# Patient Record
Sex: Female | Born: 1960 | Race: White | Hispanic: No | Marital: Married | Smoking: Never smoker
Health system: Southern US, Community
[De-identification: ages and names within clinical notes are randomized; demographics above are authoritative.]

## PROBLEM LIST (undated history)

## (undated) DIAGNOSIS — M4317 Spondylolisthesis, lumbosacral region: Secondary | ICD-10-CM

## (undated) DIAGNOSIS — K219 Gastro-esophageal reflux disease without esophagitis: Secondary | ICD-10-CM

## (undated) DIAGNOSIS — M25569 Pain in unspecified knee: Secondary | ICD-10-CM

## (undated) DIAGNOSIS — R079 Chest pain, unspecified: Secondary | ICD-10-CM

## (undated) DIAGNOSIS — E782 Mixed hyperlipidemia: Secondary | ICD-10-CM

## (undated) DIAGNOSIS — G43909 Migraine, unspecified, not intractable, without status migrainosus: Secondary | ICD-10-CM

## (undated) DIAGNOSIS — R519 Headache, unspecified: Secondary | ICD-10-CM

## (undated) DIAGNOSIS — R51 Headache: Secondary | ICD-10-CM

## (undated) HISTORY — DX: Chest pain, unspecified: R07.9

## (undated) HISTORY — PX: NO PAST SURGERIES: SHX2092

## (undated) HISTORY — DX: Pain in unspecified knee: M25.569

## (undated) HISTORY — DX: Migraine, unspecified, not intractable, without status migrainosus: G43.909

## (undated) HISTORY — DX: Mixed hyperlipidemia: E78.2

## (undated) HISTORY — DX: Spondylolisthesis, lumbosacral region: M43.17

---

## 2014-07-13 ENCOUNTER — Other Ambulatory Visit: Payer: Self-pay | Admitting: Neurosurgery

## 2014-11-30 ENCOUNTER — Encounter (HOSPITAL_COMMUNITY): Payer: Self-pay

## 2014-11-30 ENCOUNTER — Encounter (HOSPITAL_COMMUNITY)
Admission: RE | Admit: 2014-11-30 | Discharge: 2014-11-30 | Disposition: A | Discharge status: Home / Self Care | Payer: 59 | Source: Ambulatory Visit | Attending: Neurosurgery | Admitting: Neurosurgery

## 2014-11-30 DIAGNOSIS — Z01812 Encounter for preprocedural laboratory examination: Secondary | ICD-10-CM | POA: Diagnosis present

## 2014-11-30 DIAGNOSIS — Z01810 Encounter for preprocedural cardiovascular examination: Secondary | ICD-10-CM | POA: Diagnosis not present

## 2014-11-30 DIAGNOSIS — M4310 Spondylolisthesis, site unspecified: Secondary | ICD-10-CM | POA: Insufficient documentation

## 2014-11-30 DIAGNOSIS — Z0183 Encounter for blood typing: Secondary | ICD-10-CM | POA: Diagnosis not present

## 2014-11-30 HISTORY — DX: Gastro-esophageal reflux disease without esophagitis: K21.9

## 2014-11-30 HISTORY — DX: Headache, unspecified: R51.9

## 2014-11-30 HISTORY — DX: Headache: R51

## 2014-11-30 LAB — BASIC METABOLIC PANEL
ANION GAP: 6 (ref 5–15)
BUN: 7 mg/dL (ref 6–20)
CHLORIDE: 106 mmol/L (ref 101–111)
CO2: 30 mmol/L (ref 22–32)
Calcium: 9.6 mg/dL (ref 8.9–10.3)
Creatinine, Ser: 0.82 mg/dL (ref 0.44–1.00)
GFR calc non Af Amer: >60 mL/min (ref >60)
GLUCOSE: 106 mg/dL — AB (ref 65–99)
Potassium: 4.6 mmol/L (ref 3.5–5.1)
SODIUM: 142 mmol/L (ref 135–145)

## 2014-11-30 LAB — CBC
HCT: 42.3 % (ref 36.0–46.0)
Hemoglobin: 14 g/dL (ref 12.0–15.0)
MCH: 30.4 pg (ref 26.0–34.0)
MCHC: 33.1 g/dL (ref 30.0–36.0)
MCV: 92 fL (ref 78.0–100.0)
Platelets: 251 10*3/uL (ref 150–400)
RBC: 4.6 MIL/uL (ref 3.87–5.11)
RDW: 14 % (ref 11.5–15.5)
WBC: 5.4 10*3/uL (ref 4.0–10.5)

## 2014-11-30 LAB — SURGICAL PCR SCREEN
MRSA, PCR: NEGATIVE
STAPHYLOCOCCUS AUREUS: NEGATIVE

## 2014-11-30 LAB — TYPE AND SCREEN
ABO/RH(D): O POS
ANTIBODY SCREEN: NEGATIVE

## 2014-11-30 LAB — ABO/RH: ABO/RH(D): O POS

## 2014-11-30 NOTE — Pre-Procedure Instructions (Signed)
    Pamela HackingSusan M Krueger  11/30/2014     No Pharmacies Listed   Your procedure is scheduled on 12/04/14.  [facility] at 530 A.M.  Call this number if you have problems the morning of surgery:  [phone]   Remember:  Do not eat food or drink liquids after midnight.  Take these medicines the morning of surgery with A SIP OF WATER none   Do not wear jewelry, make-up or nail polish.  Do not wear lotions, powders, or perfumes.  You may wear deodorant.  Do not shave 48 hours prior to surgery.  Men may shave face and neck.  Do not bring valuables to the hospital.  [facility] not responsible for any belongings or valuables.  Contacts, dentures or bridgework may not be worn into surgery.  Leave your suitcase in the car.  After surgery it may be brought to your room.  For patients admitted to the hospital, discharge time will be determined by your treatment team.  Patients discharged the day of surgery will not be allowed to driv Special instructions:   Please read over the following fact sheets that you were given. Pain Booklet, Coughing and Deep Breathing, Blood Transfusion Information, MRSA Information and Surgical Site Infection Prevention

## 2014-12-03 MED ORDER — CEFAZOLIN SODIUM-DEXTROSE 2-3 GM-% IV SOLR
2.0000 g | INTRAVENOUS | Status: AC
  Administered 2014-12-04: 2 g via INTRAVENOUS
  Filled 2014-12-03: qty 50

## 2014-12-04 ENCOUNTER — Inpatient Hospital Stay (HOSPITAL_COMMUNITY)
Admission: RE | Admit: 2014-12-04 | Discharge: 2014-12-06 | DRG: 460 | Disposition: A | Discharge status: Home / Self Care | Payer: 59 | Source: Ambulatory Visit | Attending: Neurosurgery | Admitting: Neurosurgery

## 2014-12-04 ENCOUNTER — Encounter (HOSPITAL_COMMUNITY): Payer: Self-pay

## 2014-12-04 ENCOUNTER — Inpatient Hospital Stay (HOSPITAL_COMMUNITY): Payer: 59

## 2014-12-04 ENCOUNTER — Inpatient Hospital Stay (HOSPITAL_COMMUNITY): Payer: 59 | Admitting: Certified Registered"

## 2014-12-04 ENCOUNTER — Encounter (HOSPITAL_COMMUNITY)
Admission: RE | Disposition: A | Discharge status: Home / Self Care | Payer: Self-pay | Source: Ambulatory Visit | Attending: Neurosurgery

## 2014-12-04 DIAGNOSIS — Z79899 Other long term (current) drug therapy: Secondary | ICD-10-CM | POA: Diagnosis not present

## 2014-12-04 DIAGNOSIS — Z881 Allergy status to other antibiotic agents status: Secondary | ICD-10-CM

## 2014-12-04 DIAGNOSIS — K219 Gastro-esophageal reflux disease without esophagitis: Secondary | ICD-10-CM | POA: Diagnosis present

## 2014-12-04 DIAGNOSIS — Z419 Encounter for procedure for purposes other than remedying health state, unspecified: Secondary | ICD-10-CM

## 2014-12-04 DIAGNOSIS — Z791 Long term (current) use of non-steroidal anti-inflammatories (NSAID): Secondary | ICD-10-CM

## 2014-12-04 DIAGNOSIS — M4317 Spondylolisthesis, lumbosacral region: Principal | ICD-10-CM

## 2014-12-04 HISTORY — DX: Spondylolisthesis, lumbosacral region: M43.17

## 2014-12-04 LAB — PREGNANCY, URINE: PREG TEST UR: NEGATIVE

## 2014-12-04 SURGERY — POSTERIOR LUMBAR FUSION 1 LEVEL
Anesthesia: General | Site: Back | Laterality: Bilateral

## 2014-12-04 MED ORDER — CYCLOBENZAPRINE HCL 10 MG PO TABS
10.0000 mg | ORAL_TABLET | Freq: Three times a day (TID) | ORAL | Status: DC | PRN
  Administered 2014-12-04 – 2014-12-06 (×6): 10 mg via ORAL
  Filled 2014-12-04 (×5): qty 1

## 2014-12-04 MED ORDER — BUPIVACAINE LIPOSOME 1.3 % IJ SUSP
20.0000 mL | INTRAMUSCULAR | Status: AC
  Administered 2014-12-04: 20 mL
  Filled 2014-12-04: qty 20

## 2014-12-04 MED ORDER — MENTHOL 3 MG MT LOZG
1.0000 | LOZENGE | OROMUCOSAL | Status: DC | PRN
  Administered 2014-12-05 (×2): 3 mg via ORAL
  Filled 2014-12-04 (×2): qty 9

## 2014-12-04 MED ORDER — OXYCODONE-ACETAMINOPHEN 5-325 MG PO TABS
1.0000 | ORAL_TABLET | ORAL | Status: DC | PRN
  Administered 2014-12-04 – 2014-12-06 (×10): 2 via ORAL
  Filled 2014-12-04 (×3): qty 2
  Filled 2014-12-04: qty 1
  Filled 2014-12-04 (×7): qty 2

## 2014-12-04 MED ORDER — VANCOMYCIN HCL 1000 MG IV SOLR
INTRAVENOUS | Status: AC
  Filled 2014-12-04: qty 1000

## 2014-12-04 MED ORDER — PHENYLEPHRINE HCL 10 MG/ML IJ SOLN
INTRAMUSCULAR | Status: DC | PRN
  Administered 2014-12-04: 80 ug via INTRAVENOUS
  Administered 2014-12-04: 40 ug via INTRAVENOUS
  Administered 2014-12-04: 80 ug via INTRAVENOUS

## 2014-12-04 MED ORDER — LACTATED RINGERS IV SOLN: INTRAVENOUS | Status: DC

## 2014-12-04 MED ORDER — FENTANYL CITRATE (PF) 250 MCG/5ML IJ SOLN
INTRAMUSCULAR | Status: AC
  Filled 2014-12-04: qty 5

## 2014-12-04 MED ORDER — ADULT MULTIVITAMIN W/MINERALS CH
1.0000 | ORAL_TABLET | Freq: Every day | ORAL | Status: DC
  Administered 2014-12-05 – 2014-12-06 (×2): 1 via ORAL
  Filled 2014-12-04 (×2): qty 1

## 2014-12-04 MED ORDER — MIDAZOLAM HCL 5 MG/5ML IJ SOLN
INTRAMUSCULAR | Status: DC | PRN
  Administered 2014-12-04: 2 mg via INTRAVENOUS

## 2014-12-04 MED ORDER — AZITHROMYCIN 1 G PO PACK: 1.0000 g | PACK | Freq: Once | ORAL | Status: DC

## 2014-12-04 MED ORDER — OXYCODONE HCL 5 MG PO TABS
5.0000 mg | ORAL_TABLET | Freq: Once | ORAL | Status: AC | PRN
  Administered 2014-12-04: 5 mg via ORAL

## 2014-12-04 MED ORDER — CEFAZOLIN SODIUM 1-5 GM-% IV SOLN
1.0000 g | Freq: Three times a day (TID) | INTRAVENOUS | Status: AC
  Administered 2014-12-04 (×2): 1 g via INTRAVENOUS
  Filled 2014-12-04 (×2): qty 50

## 2014-12-04 MED ORDER — ACETAMINOPHEN 325 MG PO TABS: 650.0000 mg | ORAL_TABLET | ORAL | Status: DC | PRN

## 2014-12-04 MED ORDER — CYCLOBENZAPRINE HCL 10 MG PO TABS
ORAL_TABLET | ORAL | Status: AC
  Filled 2014-12-04: qty 1

## 2014-12-04 MED ORDER — PHENOL 1.4 % MT LIQD: 1.0000 | OROMUCOSAL | Status: DC | PRN

## 2014-12-04 MED ORDER — SODIUM CHLORIDE 0.9 % IJ SOLN
3.0000 mL | Freq: Two times a day (BID) | INTRAMUSCULAR | Status: DC
  Administered 2014-12-04 – 2014-12-05 (×2): 3 mL via INTRAVENOUS

## 2014-12-04 MED ORDER — NEOSTIGMINE METHYLSULFATE 10 MG/10ML IV SOLN
INTRAVENOUS | Status: DC | PRN
  Administered 2014-12-04: 4 mg via INTRAVENOUS

## 2014-12-04 MED ORDER — SENNOSIDES-DOCUSATE SODIUM 8.6-50 MG PO TABS: 1.0000 | ORAL_TABLET | Freq: Every evening | ORAL | Status: DC | PRN

## 2014-12-04 MED ORDER — HYDROMORPHONE HCL 1 MG/ML IJ SOLN
0.5000 mg | INTRAMUSCULAR | Status: DC | PRN
  Administered 2014-12-04: 1 mg via INTRAVENOUS
  Filled 2014-12-04: qty 1

## 2014-12-04 MED ORDER — SODIUM CHLORIDE 0.9 % IV SOLN: 250.0000 mL | INTRAVENOUS | Status: DC

## 2014-12-04 MED ORDER — GLYCOPYRROLATE 0.2 MG/ML IJ SOLN
INTRAMUSCULAR | Status: AC
  Filled 2014-12-04: qty 3

## 2014-12-04 MED ORDER — ALUM & MAG HYDROXIDE-SIMETH 200-200-20 MG/5ML PO SUSP: 30.0000 mL | Freq: Four times a day (QID) | ORAL | Status: DC | PRN

## 2014-12-04 MED ORDER — SODIUM CHLORIDE 0.9 % IJ SOLN: 3.0000 mL | INTRAMUSCULAR | Status: DC | PRN

## 2014-12-04 MED ORDER — ARTIFICIAL TEARS OP OINT
TOPICAL_OINTMENT | OPHTHALMIC | Status: DC | PRN
  Administered 2014-12-04: 1 via OPHTHALMIC

## 2014-12-04 MED ORDER — OXYCODONE HCL 5 MG/5ML PO SOLN: 5.0000 mg | Freq: Once | ORAL | Status: AC | PRN

## 2014-12-04 MED ORDER — SODIUM CHLORIDE 0.9 % IR SOLN
Status: DC | PRN
  Administered 2014-12-04: 500 mL

## 2014-12-04 MED ORDER — HYDROMORPHONE HCL 1 MG/ML IJ SOLN
0.2500 mg | INTRAMUSCULAR | Status: DC | PRN
  Administered 2014-12-04 (×4): 0.5 mg via INTRAVENOUS

## 2014-12-04 MED ORDER — FENTANYL CITRATE (PF) 100 MCG/2ML IJ SOLN
INTRAMUSCULAR | Status: DC | PRN
  Administered 2014-12-04 (×3): 50 ug via INTRAVENOUS
  Administered 2014-12-04: 100 ug via INTRAVENOUS

## 2014-12-04 MED ORDER — ONDANSETRON HCL 4 MG/2ML IJ SOLN: 4.0000 mg | INTRAMUSCULAR | Status: DC | PRN

## 2014-12-04 MED ORDER — HYDROMORPHONE HCL 1 MG/ML IJ SOLN
INTRAMUSCULAR | Status: AC
  Filled 2014-12-04: qty 1

## 2014-12-04 MED ORDER — ROCURONIUM BROMIDE 50 MG/5ML IV SOLN
INTRAVENOUS | Status: AC
  Filled 2014-12-04: qty 2

## 2014-12-04 MED ORDER — PROPOFOL 10 MG/ML IV BOLUS
INTRAVENOUS | Status: AC
  Filled 2014-12-04: qty 20

## 2014-12-04 MED ORDER — THROMBIN 20000 UNITS EX SOLR
CUTANEOUS | Status: DC | PRN
  Administered 2014-12-04: 20 mL via TOPICAL

## 2014-12-04 MED ORDER — PHENYLEPHRINE HCL 10 MG/ML IJ SOLN
10.0000 mg | INTRAVENOUS | Status: DC | PRN
  Administered 2014-12-04: 15 ug/min via INTRAVENOUS

## 2014-12-04 MED ORDER — VANCOMYCIN HCL 1000 MG IV SOLR
INTRAVENOUS | Status: DC | PRN
  Administered 2014-12-04: 1000 mg

## 2014-12-04 MED ORDER — ARTIFICIAL TEARS OP OINT
TOPICAL_OINTMENT | OPHTHALMIC | Status: AC
  Filled 2014-12-04: qty 3.5

## 2014-12-04 MED ORDER — LACTATED RINGERS IV SOLN
INTRAVENOUS | Status: DC | PRN
  Administered 2014-12-04 (×2): via INTRAVENOUS

## 2014-12-04 MED ORDER — PROPOFOL 10 MG/ML IV BOLUS
INTRAVENOUS | Status: DC | PRN
  Administered 2014-12-04: 160 mg via INTRAVENOUS

## 2014-12-04 MED ORDER — OXYCODONE HCL 5 MG PO TABS
ORAL_TABLET | ORAL | Status: AC
  Filled 2014-12-04: qty 1

## 2014-12-04 MED ORDER — GLYCOPYRROLATE 0.2 MG/ML IJ SOLN
INTRAMUSCULAR | Status: DC | PRN
  Administered 2014-12-04: 0.6 mg via INTRAVENOUS

## 2014-12-04 MED ORDER — ONDANSETRON HCL 4 MG/2ML IJ SOLN
INTRAMUSCULAR | Status: AC
Start: 2014-12-04 — End: 2014-12-04
  Filled 2014-12-04: qty 2

## 2014-12-04 MED ORDER — LIDOCAINE HCL (CARDIAC) 20 MG/ML IV SOLN
INTRAVENOUS | Status: AC
  Filled 2014-12-04: qty 5

## 2014-12-04 MED ORDER — ONDANSETRON HCL 4 MG/2ML IJ SOLN
INTRAMUSCULAR | Status: DC | PRN
  Administered 2014-12-04: 4 mg via INTRAVENOUS

## 2014-12-04 MED ORDER — NEOSTIGMINE METHYLSULFATE 10 MG/10ML IV SOLN
INTRAVENOUS | Status: AC
  Filled 2014-12-04: qty 1

## 2014-12-04 MED ORDER — MIDAZOLAM HCL 2 MG/2ML IJ SOLN
INTRAMUSCULAR | Status: AC
  Filled 2014-12-04: qty 2

## 2014-12-04 MED ORDER — ROCURONIUM BROMIDE 100 MG/10ML IV SOLN
INTRAVENOUS | Status: DC | PRN
  Administered 2014-12-04: 10 mg via INTRAVENOUS
  Administered 2014-12-04: 20 mg via INTRAVENOUS
  Administered 2014-12-04: 50 mg via INTRAVENOUS
  Administered 2014-12-04: 10 mg via INTRAVENOUS

## 2014-12-04 MED ORDER — DOCUSATE SODIUM 100 MG PO CAPS
100.0000 mg | ORAL_CAPSULE | Freq: Two times a day (BID) | ORAL | Status: DC
Start: 2014-12-04 — End: 2014-12-06
  Administered 2014-12-04 – 2014-12-06 (×4): 100 mg via ORAL
  Filled 2014-12-04 (×4): qty 1

## 2014-12-04 MED ORDER — ACETAMINOPHEN 650 MG RE SUPP: 650.0000 mg | RECTAL | Status: DC | PRN

## 2014-12-04 MED ORDER — LIDOCAINE-EPINEPHRINE 1 %-1:100000 IJ SOLN
INTRAMUSCULAR | Status: DC | PRN
Start: 2014-12-04 — End: 2014-12-04
  Administered 2014-12-04: 7 mL

## 2014-12-04 MED ORDER — ONDANSETRON HCL 4 MG/2ML IJ SOLN: 4.0000 mg | Freq: Once | INTRAMUSCULAR | Status: DC | PRN

## 2014-12-04 MED ORDER — DEXAMETHASONE SODIUM PHOSPHATE 10 MG/ML IJ SOLN
10.0000 mg | INTRAMUSCULAR | Status: AC
  Administered 2014-12-04: 10 mg via INTRAVENOUS
  Filled 2014-12-04: qty 1

## 2014-12-04 MED ORDER — LIDOCAINE HCL (CARDIAC) 20 MG/ML IV SOLN
INTRAVENOUS | Status: DC | PRN
  Administered 2014-12-04: 60 mg via INTRAVENOUS

## 2014-12-04 SURGICAL SUPPLY — 73 items
ALLOSTEM STRIP 20MMX50MM (Tissue) ×3 IMPLANT
BAG DECANTER FOR FLEXI CONT (MISCELLANEOUS) ×3 IMPLANT
BENZOIN TINCTURE PRP APPL 2/3 (GAUZE/BANDAGES/DRESSINGS) ×3 IMPLANT
BLADE CLIPPER SURG (BLADE) IMPLANT
BLADE SURG 11 STRL SS (BLADE) ×3 IMPLANT
BONE ALLOSTEM MORSELIZED 1CC (Bone Implant) ×6 IMPLANT
BRUSH SCRUB EZ PLAIN DRY (MISCELLANEOUS) ×3 IMPLANT
BUR MATCHSTICK NEURO 3.0 LAGG (BURR) ×3 IMPLANT
BUR PRECISION FLUTE 6.0 (BURR) ×3 IMPLANT
CANISTER SUCT 3000ML PPV (MISCELLANEOUS) ×3 IMPLANT
CAP LOCKING THREADED (Cap) ×12 IMPLANT
CLOSURE WOUND 1/2 X4 (GAUZE/BANDAGES/DRESSINGS) ×1
CONT SPEC 4OZ CLIKSEAL STRL BL (MISCELLANEOUS) ×6 IMPLANT
COVER BACK TABLE 60X90IN (DRAPES) ×3 IMPLANT
DECANTER SPIKE VIAL GLASS SM (MISCELLANEOUS) ×3 IMPLANT
DERMABOND ADVANCED (GAUZE/BANDAGES/DRESSINGS) ×2
DERMABOND ADVANCED .7 DNX12 (GAUZE/BANDAGES/DRESSINGS) ×1 IMPLANT
DRAPE C-ARM 42X72 X-RAY (DRAPES) ×3 IMPLANT
DRAPE C-ARMOR (DRAPES) ×3 IMPLANT
DRAPE LAPAROTOMY 100X72X124 (DRAPES) ×3 IMPLANT
DRAPE POUCH INSTRU U-SHP 10X18 (DRAPES) ×3 IMPLANT
DRAPE PROXIMA HALF (DRAPES) IMPLANT
DRAPE SURG 17X23 STRL (DRAPES) ×3 IMPLANT
DRSG OPSITE 4X5.5 SM (GAUZE/BANDAGES/DRESSINGS) ×3 IMPLANT
DRSG OPSITE POSTOP 4X8 (GAUZE/BANDAGES/DRESSINGS) ×3 IMPLANT
DURAPREP 26ML APPLICATOR (WOUND CARE) ×3 IMPLANT
ELECT REM PT RETURN 9FT ADLT (ELECTROSURGICAL) ×3
ELECTRODE REM PT RTRN 9FT ADLT (ELECTROSURGICAL) ×1 IMPLANT
EVACUATOR 3/16  PVC DRAIN (DRAIN) ×2
EVACUATOR 3/16 PVC DRAIN (DRAIN) ×1 IMPLANT
GAUZE SPONGE 4X4 12PLY STRL (GAUZE/BANDAGES/DRESSINGS) ×3 IMPLANT
GAUZE SPONGE 4X4 16PLY XRAY LF (GAUZE/BANDAGES/DRESSINGS) ×3 IMPLANT
GLOVE BIO SURGEON STRL SZ8 (GLOVE) ×9 IMPLANT
GLOVE BIOGEL PI IND STRL 7.5 (GLOVE) ×2 IMPLANT
GLOVE BIOGEL PI INDICATOR 7.5 (GLOVE) ×4
GLOVE ECLIPSE 7.5 STRL STRAW (GLOVE) IMPLANT
GLOVE EXAM NITRILE LRG STRL (GLOVE) IMPLANT
GLOVE EXAM NITRILE MD LF STRL (GLOVE) IMPLANT
GLOVE EXAM NITRILE XL STR (GLOVE) IMPLANT
GLOVE EXAM NITRILE XS STR PU (GLOVE) IMPLANT
GLOVE INDICATOR 8.5 STRL (GLOVE) ×9 IMPLANT
GLOVE SURG SS PI 7.0 STRL IVOR (GLOVE) ×6 IMPLANT
GOWN STRL REUS W/ TWL LRG LVL3 (GOWN DISPOSABLE) ×2 IMPLANT
GOWN STRL REUS W/ TWL XL LVL3 (GOWN DISPOSABLE) ×3 IMPLANT
GOWN STRL REUS W/TWL 2XL LVL3 (GOWN DISPOSABLE) IMPLANT
GOWN STRL REUS W/TWL LRG LVL3 (GOWN DISPOSABLE) ×4
GOWN STRL REUS W/TWL XL LVL3 (GOWN DISPOSABLE) ×6
KIT BASIN OR (CUSTOM PROCEDURE TRAY) ×3 IMPLANT
KIT ROOM TURNOVER OR (KITS) ×3 IMPLANT
LIQUID BAND (GAUZE/BANDAGES/DRESSINGS) IMPLANT
NEEDLE HYPO 21X1.5 SAFETY (NEEDLE) ×3 IMPLANT
NEEDLE HYPO 25X1 1.5 SAFETY (NEEDLE) ×3 IMPLANT
NS IRRIG 1000ML POUR BTL (IV SOLUTION) ×3 IMPLANT
PACK LAMINECTOMY NEURO (CUSTOM PROCEDURE TRAY) ×3 IMPLANT
PAD ARMBOARD 7.5X6 YLW CONV (MISCELLANEOUS) ×9 IMPLANT
PATTIES SURGICAL .5 X.5 (GAUZE/BANDAGES/DRESSINGS) ×3 IMPLANT
ROD 40MM SPINAL (Rod) ×6 IMPLANT
SCREW CREO 6.5X40 (Screw) ×12 IMPLANT
SPACER RISE 10X22 9-15MM (Spacer) ×6 IMPLANT
SPONGE LAP 4X18 X RAY DECT (DISPOSABLE) IMPLANT
SPONGE SURGIFOAM ABS GEL 100 (HEMOSTASIS) ×3 IMPLANT
STRIP CLOSURE SKIN 1/2X4 (GAUZE/BANDAGES/DRESSINGS) ×2 IMPLANT
SUT VIC AB 0 CT1 18XCR BRD8 (SUTURE) ×2 IMPLANT
SUT VIC AB 0 CT1 8-18 (SUTURE) ×4
SUT VIC AB 2-0 CT1 18 (SUTURE) ×3 IMPLANT
SUT VICRYL 4-0 PS2 18IN ABS (SUTURE) ×3 IMPLANT
SYR 20CC LL (SYRINGE) ×3 IMPLANT
SYR 20ML ECCENTRIC (SYRINGE) ×3 IMPLANT
TOWEL OR 17X24 6PK STRL BLUE (TOWEL DISPOSABLE) ×3 IMPLANT
TOWEL OR 17X26 10 PK STRL BLUE (TOWEL DISPOSABLE) ×3 IMPLANT
TRAY FOLEY W/METER SILVER 14FR (SET/KITS/TRAYS/PACK) ×3 IMPLANT
TULIP CREP AMP 5.5MM (Orthopedic Implant) ×12 IMPLANT
WATER STERILE IRR 1000ML POUR (IV SOLUTION) ×3 IMPLANT

## 2014-12-04 NOTE — Anesthesia Postprocedure Evaluation (Signed)
  Anesthesia Post-op Note  Patient: Pamela Krueger  Procedure(s) Performed: Procedure(s): POSTERIOR LUMBAR FUSION 1 LEVEL (Bilateral)  Patient Location: PACU  Anesthesia Type:General  Level of Consciousness: awake, alert  and oriented  Airway and Oxygen Therapy: Patient Spontanous Breathing and Patient connected to nasal cannula oxygen  Post-op Pain: mild  Post-op Assessment: Post-op Vital signs reviewed, Patient's Cardiovascular Status Stable, Respiratory Function Stable, Patent Airway and Pain level controlled  Post-op Vital Signs: Reviewed and stable  Last Vitals:  Filed Vitals:   12/04/14 1240  BP: 127/75  Pulse: 88  Temp: 36.4 C  Resp: 18    Complications: No apparent anesthesia complications

## 2014-12-04 NOTE — Transfer of Care (Signed)
Immediate Anesthesia Transfer of Care Note  Patient: Pamela Krueger CancerSusan M Nordquist  Procedure(s) Performed: Procedure(s): POSTERIOR LUMBAR FUSION 1 LEVEL (Bilateral)  Patient Location: PACU  Anesthesia Type:General  Level of Consciousness: awake, alert  and oriented  Airway & Oxygen Therapy: Patient Spontanous Breathing and Patient connected to nasal cannula oxygen  Post-op Assessment: Report given to RN, Post -op Vital signs reviewed and stable and Patient moving all extremities X 4  Post vital signs: Reviewed and stable  Last Vitals:  Filed Vitals:   12/04/14 0608  BP: 147/85  Pulse: 81  Temp: 37.2 C  Resp: 20    Complications: No apparent anesthesia complications

## 2014-12-04 NOTE — H&P (Signed)
Pamela Krueger is an 54 y.o. female.   Chief Complaint: Back and leg pain HPI: patient is a very pleasant 54 year old female who has had long-standing back and bilateral leg pain radiating down L5 nerve root pattern. 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 Due to patient's failure conservative treatment over the years progression of clinical syndrome and imaging findings have recommended decompression stabilization procedure at L5-S1. I've extensively gone over the risks and benefits of the operation with the patient as well as perioperative course expectations of outcome and alternatives of surgery and she understands and agrees to proceed forward.  Past Medical History  Diagnosis Date  . GERD (gastroesophageal reflux disease)   . Headache     Past Surgical History  Procedure Laterality Date  . No past surgeries      History reviewed. No pertinent family history. Social History:  reports that she has never smoked. She does not have any smokeless tobacco history on file. She reports that she drinks alcohol. She reports that she does not use illicit drugs.  Allergies:  Allergies  Allergen Reactions  . Sulfa Antibiotics     unknown    Medications Prior to Admission  Medication Sig Dispense Refill  . azithromycin (ZITHROMAX) 1 G powder Take 1 g by mouth once. Finished yesterday.    Marland Kitchen. ibuprofen (ADVIL,MOTRIN) 200 MG tablet Take 200-400 mg by mouth every 8 (eight) hours as needed for mild pain.    . Multiple Vitamin (MULTIVITAMIN WITH MINERALS) TABS tablet Take 1 tablet by mouth daily.      No results found for this or any previous visit (from the past 48 hour(s)). No results found.  Review of Systems  Constitutional: Negative.   HENT: Negative.   Eyes: Negative.   Respiratory: Negative.   Gastrointestinal: Negative.   Genitourinary: Negative.   Musculoskeletal: Positive for myalgias and back pain.  Skin: Negative.    Neurological: Positive for tingling and sensory change.  Psychiatric/Behavioral: Negative.     Blood pressure 147/85, pulse 81, temperature 98.9 F (37.2 C), temperature source Oral, resp. rate 20, weight 77.111 kg (170 lb), last menstrual period 09/14/2014, SpO2 98 %. Physical Exam  Constitutional: She is oriented to person, place, and time. She appears well-developed and well-nourished.  Eyes: Pupils are equal, round, and reactive to light.  Neck: Normal range of motion.  Respiratory: Effort normal.  GI: Soft. Bowel sounds are normal.  Neurological: She is alert and oriented to person, place, and time. She has normal strength. GCS eye subscore is 4. GCS verbal subscore is 5. GCS motor subscore is 6.  Strength is 5 out of 5 in her iliopsoas, quads, hip she's, gastrocs, anterior tibialis, and EHL.     Assessment/Plan 54 year old female presents for decompression stabilization procedure at L5-S1.  Jahfari Ambers P 12/04/2014, 7:04 AM

## 2014-12-04 NOTE — Op Note (Signed)
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  Postoperative diagnosis: Same  Procedure: #1 Gill decompression L5-S1 with radical foraminotomies of the L5 and S1 nerve roots  #2 posterior lumbar interbody fusion L5-S1 utilizing the globus rise expandable cage system with locally harvested autograft mixed with allostem morsels and strips  #3 pedicle screw fixation using the globusCreo modular 5.5 pedicle screw set was 6 5 x 40 screws inserted L5 and S1  #4 posterior lateral arthrodesis L5-S1 using the locally harvested autograft mixed  #5 open reduction spinal deformity  #6 placement of a large Hemovac drain  Surgeon: Jillyn Hidden Pasqual Farias  Asst.: Barnett Abu  Anesthesia: Gen.  EBL: 300  History of present illness: Patient is a very pleasant 54 year old female is a long-standing back and bilateral leg pain rating down L5 and S1 nerve root pattern. Workup revealed severe lumbar spinal stenosis and a grade 1 isthmic spinal listhesis at L5-S1 patient failed all forms of conservative treatment and with her failure conservative treatment progression of clinical syndrome and imaging findings a recommended decompression stabilization procedure at L5-S1. I extensively went over the risks and benefits of the operation with the patient as well as perioperative course expectations of outcome and alternatives surgery she understands and agrees to proceed forward.  Operative procedure: Patient brought into the or was induced under general anesthesia positioned prone her back was prepped and draped in routine sterile fashion preoperative localizing appropriate level so after infiltration 10 mL lidocaine with epi a midline incision was made and Bovie light cautery was used to calcification subperiosteal dissections care lamina of L5 and S1 bilaterally exposing the TPs at L5 bilaterally at the spinal and a complex was noted be  hypermobile interoperative x-ray did confirm the appropriate level so still spinous processes at L5 was removed central decompression was begun complete the facetectomies were performed the inferomedial aspect of both pedicles was drilled down to identify the proximal aspect of the L5 foramen which was markedly stenosed from a dense amount of granulation tissue and epidural fibrosis this is all teased off of the L5 nerve root and marking laterally with a 2 and 3 minute Kerrison punch the L5 foramen was unroofed. Aggressive under biting of the superior tickling facet of S1 allowed indication lateral aspect the disc space. Marching inferiorly identified the S1 nerve root and decompress the S1 nerve root flush with the S1 pedicle. At the end of decompression the L5 nerve root and foramen and S1 nerve root foramen were clearly visible and unroofed. Disc spaces and incised bilaterally epidural veins are correlated I used 20 mm Kerrison punch to bite off some posterior osteophyte off the S1 endplate. Then using sequential distraction starting with a 5 mm distractor marching up to ultimately at T10 significant reduction of the listhesis was carried out and performed. With a 10 distractor in place to cleaned out the disc space. The endplates and inserted a 9-15 expandable cage at 4 lordosis after it up and packed with the locally harvested autograft mix. Contralateral side was then prepared in a similar fashion local autograft mix was packed centrally and the contralateral cage was inserted and expanded similar fashion. After the fluoroscopy used the step will await confirmed good position of the interbody implants stents taken to pedicle screw placement using direct visualization of bony landmarks as well as fluoroscopy pilot holes were drilled at L5 cannulated with the awl probed O45 tap initially probed again and then went  to 55 Probed again and 6 5 x 40 screws were inserted L5 bilaterally. All screws excellent  purchase. Then I placed S1 screws in similar fashion was 6 5 x 40 screws at S1. the wounds and copiously irrigated with bacitracin then aggressive decortication was care MTPs or lateral facet complexes at L5-S1 the remainder of the local autograft mix was packed posterior laterally from L5 TP to the lateral aspect of second complex and at S1. Then rods were placed heads were assembled on the screws rods were placed top tightness tightened down everything was anchored in place the foraminal reinspected confirm patency and it was wide decompression of both the L5-S1 nerve roots. Then Gelfoam was laid top of the dura and clindamycin powder was then placed in the wound the wounds closed in layers with interrupted Vicryls after placement of a large Hemovac drain then brought was injected in the muscle or frank powder then the wound was irrigated prior to the bank powder and the extra foraminal and closed sequentially in layers with Vicryls and a running 4 subcuticular in the skin.

## 2014-12-04 NOTE — Anesthesia Procedure Notes (Signed)
Procedure Name: Intubation Date/Time: 12/04/2014 7:38 AM Performed by: Lanell MatarBAKER, Larenda Reedy M Pre-anesthesia Checklist: Timeout performed, Patient identified, Emergency Drugs available, Suction available and Patient being monitored Patient Re-evaluated:Patient Re-evaluated prior to inductionOxygen Delivery Method: Circle system utilized Preoxygenation: Pre-oxygenation with 100% oxygen Intubation Type: IV induction Ventilation: Mask ventilation without difficulty and Oral airway inserted - appropriate to patient size Laryngoscope Size: Hyacinth MeekerMiller and 2 Grade View: Grade I Tube type: Oral Tube size: 7.5 mm Number of attempts: 1 Airway Equipment and Method: Stylet Placement Confirmation: ETT inserted through vocal cords under direct vision,  breath sounds checked- equal and bilateral and positive ETCO2 Secured at: 22 cm Tube secured with: Tape Dental Injury: Teeth and Oropharynx as per pre-operative assessment

## 2014-12-04 NOTE — Anesthesia Preprocedure Evaluation (Addendum)
Anesthesia Evaluation  Patient identified by MRN, date of birth, ID band Patient awake    Airway Mallampati: I  TM Distance: >3 FB Neck ROM: Full    Dental  (+) Teeth Intact, Dental Advisory Given   Pulmonary  breath sounds clear to auscultation        Cardiovascular Rhythm:Regular Rate:Normal     Neuro/Psych  Headaches,    GI/Hepatic GERD-  Medicated,  Endo/Other    Renal/GU      Musculoskeletal   Abdominal (+)  Abdomen: soft.    Peds  Hematology   Anesthesia Other Findings   Reproductive/Obstetrics                            Anesthesia Physical Anesthesia Plan  ASA: I  Anesthesia Plan: General   Post-op Pain Management:    Induction: Intravenous  Airway Management Planned: Oral ETT  Additional Equipment:   Intra-op Plan:   Post-operative Plan: Extubation in OR  Informed Consent: I have reviewed the patients History and Physical, chart, labs and discussed the procedure including the risks, benefits and alternatives for the proposed anesthesia with the patient or authorized representative who has indicated his/her understanding and acceptance.   Dental advisory given  Plan Discussed with: CRNA and Anesthesiologist  Anesthesia Plan Comments:        Anesthesia Quick Evaluation

## 2014-12-05 MED ORDER — CYCLOBENZAPRINE HCL 10 MG PO TABS: 10.0000 mg | ORAL_TABLET | Freq: Three times a day (TID) | ORAL | Status: AC | PRN

## 2014-12-05 MED ORDER — SENNA 8.6 MG PO TABS
1.0000 | ORAL_TABLET | Freq: Every day | ORAL | Status: DC
  Administered 2014-12-05: 8.6 mg via ORAL
  Filled 2014-12-05: qty 1

## 2014-12-05 MED ORDER — OXYCODONE-ACETAMINOPHEN 5-325 MG PO TABS: 1.0000 | ORAL_TABLET | ORAL | Status: DC | PRN

## 2014-12-05 NOTE — Progress Notes (Signed)
Subjective: Patient reports Doing well condition and back pain well-controlled no leg pain.  Objective: Vital signs in last 24 hours: Temp:  [97.5 F (36.4 C)-98.2 F (36.8 C)] 98 F (36.7 C) (06/07 0519) Pulse Rate:  [70-106] 88 (06/07 0519) Resp:  [10-22] 18 (06/07 0519) BP: (103-136)/(54-78) 114/54 mmHg (06/07 0519) SpO2:  [95 %-100 %] 98 % (06/07 0519)  Intake/Output from previous day: 06/06 0701 - 06/07 0700 In: 2160 [Krueger.O.:360; I.V.:1800] Out: 3120 [Urine:2660; Drains:160; Blood:300] Intake/Output this shift: Total I/O In: -  Out: 1870 [Urine:1850; Drains:20]  Strength out of 5 wound clean dry and intact  Lab Results: No results for input(s): WBC, HGB, HCT, PLT in the last 72 hours. BMET No results for input(s): NA, K, CL, CO2, GLUCOSE, BUN, CREATININE, CALCIUM in the last 72 hours.  Studies/Results: Dg Lumbar Spine 2-3 Views  12/04/2014   CLINICAL DATA:  L5-S1 PLIF.  EXAM: DG C-ARM 61-120 MIN; LUMBAR SPINE - 2-3 VIEW  TECHNIQUE: Two images from portable C-arm radiography were obtained in the AP and lateral orientation.  FLUOROSCOPY TIME:  Radiation Exposure Index (as provided by the fluoroscopic device):  If the device does not provide the exposure index:  Fluoroscopy Time (in minutes and seconds):  40 seconds.  Number of Acquired Images:  2  COMPARISON:  10/19/2014  FINDINGS: AP and lateral orientation images show placement of pedicle screws at the L5 and S1 level bilaterally. Interbody spacer device is identified at the L5-S1 level.  IMPRESSION: 1. Images show laminectomy and hardware placement at the L5-S1 level.   Electronically Signed   By: Signa Kellaylor  Stroud M.D.   On: 12/04/2014 12:18   Dg C-arm 1-60 Min  12/04/2014   CLINICAL DATA:  L5-S1 PLIF.  EXAM: DG C-ARM 61-120 MIN; LUMBAR SPINE - 2-3 VIEW  TECHNIQUE: Two images from portable C-arm radiography were obtained in the AP and lateral orientation.  FLUOROSCOPY TIME:  Radiation Exposure Index (as provided by the fluoroscopic  device):  If the device does not provide the exposure index:  Fluoroscopy Time (in minutes and seconds):  40 seconds.  Number of Acquired Images:  2  COMPARISON:  10/19/2014  FINDINGS: AP and lateral orientation images show placement of pedicle screws at the L5 and S1 level bilaterally. Interbody spacer device is identified at the L5-S1 level.  IMPRESSION: 1. Images show laminectomy and hardware placement at the L5-S1 level.   Electronically Signed   By: Signa Kellaylor  Stroud M.D.   On: 12/04/2014 12:18    Assessment/Plan: Mobilization today with physical and occupational therapy.  LOS: 1 day     Pamela Krueger 12/05/2014, 6:40 AM

## 2014-12-05 NOTE — Evaluation (Signed)
Physical Therapy Evaluation Patient Details Name: Pamela Krueger MRN: [id number] DOB: March 05, 1961 Today's Date: 12/05/2014   History of Present Illness  pt presents with L5-S1 PLIF.    Clinical Impression  Pt moving well and will have good support from family at home.  Will continue to follow while on acute and anticipate she will be ready for D/C from PT perspective tomorrow.      Follow Up Recommendations Home health PT;Supervision - Intermittent    Equipment Recommendations  None recommended by PT    Recommendations for Other Services       Precautions / Restrictions Precautions Precautions: Back Precaution Booklet Issued: Yes (comment) Required Braces or Orthoses: Spinal Brace Spinal Brace: Lumbar corset;Applied in sitting position Restrictions Weight Bearing Restrictions: No      Mobility  Bed Mobility Overal bed mobility: Needs Assistance Bed Mobility: Rolling;Sidelying to Sit;Sit to Sidelying Rolling: Supervision Sidelying to sit: Supervision     Sit to sidelying: Supervision General bed mobility comments: No physical A needed, just cues for safe technique.    Transfers Overall transfer level: Needs assistance Equipment used: Rolling walker (2 wheeled) Transfers: Sit to/from Stand Sit to Stand: Supervision         General transfer comment: cues for safe technique only.    Ambulation/Gait Ambulation/Gait assistance: Supervision Ambulation Distance (Feet): 180 Feet Assistive device: Rolling walker (2 wheeled) Gait Pattern/deviations: Step-through pattern;Decreased stride length     General Gait Details: pt demonstrates good use of RW and only cues for maintaining back precautions with turns.    Stairs            Wheelchair Mobility    Modified Rankin (Stroke Patients Only)       Balance Overall balance assessment: No apparent balance deficits (not formally assessed)                                            Pertinent Vitals/Pain Pain Assessment: 0-10 Pain Score: 5  Pain Location: Back Pain Descriptors / Indicators: Aching Pain Intervention(s): Monitored during session;Premedicated before session;Repositioned    Home Living Family/patient expects to be discharged to:: Private residence Living Arrangements: Spouse/significant other Available Help at Discharge: Family;Available 24 hours/day Type of Home: House Home Access: Stairs to enter Entrance Stairs-Rails: None Entrance Stairs-Number of Steps: 1 Home Layout: One level Home Equipment: Walker - 2 wheels;Toilet riser Additional Comments: pt to borrow DME from family.    Prior Function Level of Independence: Independent               Hand Dominance        Extremity/Trunk Assessment   Upper Extremity Assessment: Defer to OT evaluation           Lower Extremity Assessment: Overall WFL for tasks assessed      Cervical / Trunk Assessment: Normal  Communication   Communication: No difficulties  Cognition Arousal/Alertness: Awake/alert Behavior During Therapy: WFL for tasks assessed/performed Overall Cognitive Status: Within Functional Limits for tasks assessed                      General Comments      Exercises        Assessment/Plan    PT Assessment Patient needs continued PT services  PT Diagnosis Difficulty walking;Acute pain   PT Problem List Decreased activity tolerance;Decreased balance;Decreased mobility;Decreased knowledge of use of DME;Decreased knowledge  of precautions;Pain  PT Treatment Interventions DME instruction;Gait training;Stair training;Functional mobility training;Therapeutic activities;Therapeutic exercise;Balance training;Neuromuscular re-education;Patient/family education   PT Goals (Current goals can be found in the Care Plan section) Acute Rehab PT Goals Patient Stated Goal: Walk without pain. PT Goal Formulation: With patient Time For Goal Achievement:  12/12/14 Potential to Achieve Goals: Good    Frequency Min 5X/week   Barriers to discharge        Co-evaluation               End of Session Equipment Utilized During Treatment: Gait belt;Back brace Activity Tolerance: Patient tolerated treatment well Patient left: in bed;with call bell/phone within reach;with family/visitor present Nurse Communication: Mobility status         Time: 1191-4782 PT Time Calculation (min) (ACUTE ONLY): 24 min   Charges:   PT Evaluation $Initial PT Evaluation Tier I: 1 Procedure PT Treatments $Gait Training: 8-22 mins   PT G CodesSunny Schlein, [place] [phone] 12/05/2014, 11:10 AM

## 2014-12-05 NOTE — Evaluation (Signed)
Occupational Therapy Evaluation Patient Details Name: MARYSUE FAIT MRN: [id number] DOB: 06-Mar-1961 Today's Date: 12/05/2014    History of Present Illness 54 yo female PLIF L 5- s1   Clinical Impression   Patient evaluated by Occupational Therapy with no further acute OT needs identified. All education has been completed and the patient has no further questions. See below for any follow-up Occupational Therapy or equipment needs. OT to sign off. Thank you for referral. Pt is at adequate level for d/c home     Follow Up Recommendations  No OT follow up    Equipment Recommendations  None recommended by OT    Recommendations for Other Services       Precautions / Restrictions Precautions Precautions: Back Precaution Booklet Issued: Yes (comment) Precaution Comments: handout in room and provided with additional instructions Required Braces or Orthoses: Spinal Brace Spinal Brace: Lumbar corset;Applied in sitting position Restrictions Weight Bearing Restrictions: No      Mobility Bed Mobility Overal bed mobility: Modified Independent Bed Mobility: Rolling;Sidelying to Sit;Sit to Sidelying Rolling: Supervision Sidelying to sit: Supervision     Sit to sidelying: Supervision General bed mobility comments: No physical A needed, just cues for safe technique.    Transfers Overall transfer level: Needs assistance Equipment used: Rolling walker (2 wheeled) Transfers: Sit to/from Stand Sit to Stand: Supervision         General transfer comment: cues for safe technique only.      Balance Overall balance assessment: No apparent balance deficits (not formally assessed)                                          ADL Overall ADL's : Needs assistance/impaired Eating/Feeding: Independent   Grooming: Wash/dry hands;Wash/dry face;Oral care;Supervision/safety   Upper Body Bathing: Supervision/ safety;Sitting   Lower Body Bathing: Supervison/  safety;Sit to/from stand;With adaptive equipment (educated on AE use)   Upper Body Dressing : Supervision/safety   Lower Body Dressing: Supervision/safety;Sit to/from stand;With adaptive equipment Lower Body Dressing Details (indicate cue type and reason): use of AE Toilet Transfer: Supervision/safety;Regular Toilet;Ambulation;RW;Grab bars Toilet Transfer Details (indicate cue type and reason): pt educated on safety at home and use of toilet riser with sink  Toileting- Clothing Manipulation and Hygiene: Modified independent;Sit to/from stand;With adaptive equipment Toileting - Clothing Manipulation Details (indicate cue type and reason): pt using toilet tongs and plans to purchase Tub/ Shower Transfer: Therapist, sports Details (indicate cue type and reason): simulated exiting the bathroom and return demo with good recall. Pt able to clear 6 inch threshold Functional mobility during ADLs: Supervision/safety;Rolling walker General ADL Comments: Pt plans to purchase AE for home use , pt will have spouse 24/7 (A) pt is at adequate level for d/c home     Vision     Perception     Praxis      Pertinent Vitals/Pain Pain Assessment: No/denies pain Pain Score: 5  Pain Location: Back Pain Descriptors / Indicators: Aching Pain Intervention(s): Monitored during session;Premedicated before session;Repositioned     Hand Dominance Right   Extremity/Trunk Assessment Upper Extremity Assessment Upper Extremity Assessment: Overall WFL for tasks assessed   Lower Extremity Assessment Lower Extremity Assessment: Defer to PT evaluation   Cervical / Trunk Assessment Cervical / Trunk Assessment: Other exceptions (s/p surg)   Communication Communication Communication: No difficulties   Cognition Arousal/Alertness: Awake/alert Behavior During Therapy: WFL for tasks assessed/performed Overall  Cognitive Status: Within Functional Limits for tasks assessed                      General Comments       Exercises       Shoulder Instructions      Home Living Family/patient expects to be discharged to:: Private residence Living Arrangements: Spouse/significant other Available Help at Discharge: Family;Available 24 hours/day Type of Home: House Home Access: Stairs to enter Entergy CorporationEntrance Stairs-Number of Steps: 1 Entrance Stairs-Rails: None Home Layout: One level     Bathroom Shower/Tub: Producer, television/film/videoWalk-in shower   Bathroom Toilet: Standard     Home Equipment: Environmental consultantWalker - 2 wheels;Toilet riser   Additional Comments: pt to borrow DME from family.      Prior Functioning/Environment Level of Independence: Independent             OT Diagnosis:     OT Problem List:     OT Treatment/Interventions:      OT Goals(Current goals can be found in the care plan section) Acute Rehab OT Goals Patient Stated Goal: Walk without pain.  OT Frequency:     Barriers to D/C:            Co-evaluation              End of Session Equipment Utilized During Treatment: Gait belt;Rolling walker;Back brace Nurse Communication: Mobility status;Precautions  Activity Tolerance: Patient tolerated treatment well Patient left: in chair;with call bell/phone within reach;with chair alarm set;with family/visitor present   Time: 1129-1203 OT Time Calculation (min): 34 min Charges:  OT General Charges $OT Visit: 1 Procedure OT Evaluation $Initial OT Evaluation Tier I: 1 Procedure OT Treatments $Self Care/Home Management : 8-22 mins G-Codes:    Harolyn RutherfordJones, Clorissa Gruenberg B 12/05/2014, 2:09 PM Pager: [phone]

## 2014-12-06 NOTE — Progress Notes (Signed)
Patient DC'd home via car with husband.  DC instructions and prescriptions given to patient.  Both fully understood by patient.  Vital signs and assessments were stable.

## 2014-12-06 NOTE — Progress Notes (Signed)
Physical Therapy Treatment Patient Details Name: Pamela Krueger MRN: [id number] DOB: [birth year] Today's Date: 12/06/2014    History of Present Illness 54 yo female PLIF L 5- s1    PT Comments    Pt moving well despite being painful and fatigued from not sleeping last night.  Pt demonstrates good safety and is ready for D/C from PT perspective.    Follow Up Recommendations  Home health PT;Supervision - Intermittent     Equipment Recommendations  None recommended by PT    Recommendations for Other Services       Precautions / Restrictions Precautions Precautions: Back Precaution Booklet Issued: Yes (comment) Precaution Comments: pt able to verbalize 3/3 back precautions.   Required Braces or Orthoses: Spinal Brace Spinal Brace: Lumbar corset;Applied in sitting position Restrictions Weight Bearing Restrictions: No    Mobility  Bed Mobility Overal bed mobility: Modified Independent                Transfers Overall transfer level: Modified independent Equipment used: Rolling walker (2 wheeled)                Ambulation/Gait Ambulation/Gait assistance: Modified independent (Device/Increase time) Ambulation Distance (Feet): 60 Feet Assistive device: Rolling walker (2 wheeled) Gait Pattern/deviations: Step-through pattern;Decreased stride length     General Gait Details: pt demonstrates good use of RW and awareness of safety.  Mobility limited by pain and fatigue from not sleeping last night.     Stairs Stairs: Yes Stairs assistance: Supervision Stair Management: One rail Right;Step to pattern;Forwards Number of Stairs: 1 General stair comments: cues for safe technique with pt able to return demonstration.    Wheelchair Mobility    Modified Rankin (Stroke Patients Only)       Balance Overall balance assessment: No apparent balance deficits (not formally assessed)                                  Cognition Arousal/Alertness:  Awake/alert Behavior During Therapy: WFL for tasks assessed/performed Overall Cognitive Status: Within Functional Limits for tasks assessed                      Exercises      General Comments        Pertinent Vitals/Pain Pain Assessment: 0-10 Pain Score: 6  Pain Location: Back Pain Descriptors / Indicators: Aching;Constant Pain Intervention(s): Limited activity within patient's tolerance;Monitored during session;Premedicated before session;Repositioned    Home Living                      Prior Function            PT Goals (current goals can now be found in the care plan section) Acute Rehab PT Goals Patient Stated Goal: Walk without pain. PT Goal Formulation: With patient Time For Goal Achievement: 12/12/14 Potential to Achieve Goals: Good Progress towards PT goals: Progressing toward goals    Frequency  Min 5X/week    PT Plan Current plan remains appropriate    Co-evaluation             End of Session Equipment Utilized During Treatment: Back brace Activity Tolerance: Patient tolerated treatment well Patient left: in bed;with call bell/phone within reach;with family/visitor present     Time: 4098-1191 PT Time Calculation (min) (ACUTE ONLY): 17 min  Charges:  $Gait Training: 8-22 mins  G CodesSunny Schlein:      Tyria Springer F, [place] CarolinaPT [phone] 12/06/2014, 9:18 AM

## 2014-12-06 NOTE — Discharge Summary (Signed)
Physician Discharge Summary  Patient ID: [name] MRN: [id number] DOB/AGE: [birth year] 54 y.o.  Admit date: 12/04/2014 Discharge date: 12/06/2014  Admission Diagnoses: L5-S1 isthmic spondylolisthesis with radiculopathy  Discharge Diagnoses: L5-S1 isthmic spondylolisthesis with radiculopathy  Active Problems:   Spondylolisthesis at L5-S1 level   Discharged Condition: good  Hospital Course: Patient was admitted to undergo surgical decompression stabilization at L5-S1. She tolerated surgery well.  Consults: None  Significant Diagnostic Studies: None  Treatments: surgery: Decompression L5-S1 with posterior lumbar interbody arthrodesis L5-S1 pedicle screw fixation L5-S1. Posterior lateral arthrodesis L5-S1  Discharge Exam: Blood pressure 127/66, pulse 94, temperature 98 F (36.7 C), temperature source Oral, resp. rate 18, weight 77.111 kg (170 lb), last menstrual period 09/14/2014, SpO2 97 %. Dressing is clean dry and intact motor function is intact in lower extremities. Station and gait is intact  Disposition: Discharge home  Discharge Instructions    Diet - low sodium heart healthy    Complete by:  As directed      Increase activity slowly    Complete by:  As directed             Medication List    TAKE these medications        azithromycin 1 G powder  Commonly known as:  ZITHROMAX  Take 1 g by mouth once. Finished yesterday.     cyclobenzaprine 10 MG tablet  Commonly known as:  FLEXERIL  Take 1 tablet (10 mg total) by mouth 3 (three) times daily as needed for muscle spasms.     ibuprofen 200 MG tablet  Commonly known as:  ADVIL,MOTRIN  Take 200-400 mg by mouth every 8 (eight) hours as needed for mild pain.     multivitamin with minerals Tabs tablet  Take 1 tablet by mouth daily.     oxyCODONE-acetaminophen 5-325 MG per tablet  Commonly known as:  PERCOCET/ROXICET  Take 1-2 tablets by mouth every 4 (four) hours as needed for moderate pain.          SignedStefani Dama: Toleen Lachapelle J 12/06/2014, 12:03 PM

## 2014-12-11 ENCOUNTER — Other Ambulatory Visit (HOSPITAL_COMMUNITY): Payer: Self-pay

## 2014-12-12 ENCOUNTER — Encounter (HOSPITAL_COMMUNITY): Payer: Self-pay | Admitting: Neurosurgery

## 2014-12-12 NOTE — OR Nursing (Signed)
[employer] times documented  by Karmen Stabs RN

## 2015-06-01 ENCOUNTER — Other Ambulatory Visit: Payer: Self-pay | Admitting: Neurosurgery

## 2015-06-01 DIAGNOSIS — M48061 Spinal stenosis, lumbar region without neurogenic claudication: Secondary | ICD-10-CM

## 2015-06-07 ENCOUNTER — Ambulatory Visit
Admission: RE | Admit: 2015-06-07 | Discharge: 2015-06-07 | Disposition: A | Discharge status: Home / Self Care | Payer: 59 | Source: Ambulatory Visit | Attending: Neurosurgery | Admitting: Neurosurgery

## 2015-06-07 DIAGNOSIS — M48061 Spinal stenosis, lumbar region without neurogenic claudication: Secondary | ICD-10-CM

## 2015-12-29 IMAGING — CT CT L SPINE W/O CM
3 of 8 series · 11 of 33 positions shown, 13 images · non-contrast
Comparison: Lumbar spine MRI 10/19/2014

CLINICAL DATA: Lumbar spinal stenosis. Left leg numbness and left
foot spasms for 3 weeks. Lumbar fusion in [DATE].

EXAM:
CT LUMBAR SPINE WITHOUT CONTRAST
TECHNIQUE: Multidetector CT imaging of the lumbar spine was performed without
intravenous contrast administration. Multiplanar CT image
reconstructions were also generated.

[Series 2: l spine soft (person_name) · axial · 0.27mm/px · z∈[-245,-86]mm · 3 of 81 slices shown, 4 images]
[im 14/81  soft-tissue]
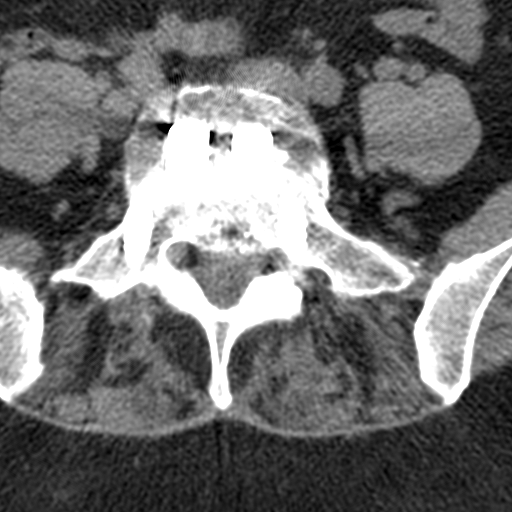
[im 14/81  bone]
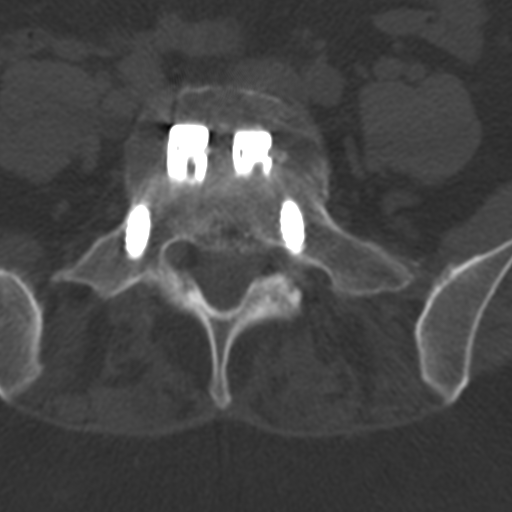
[im 41/81  bone]
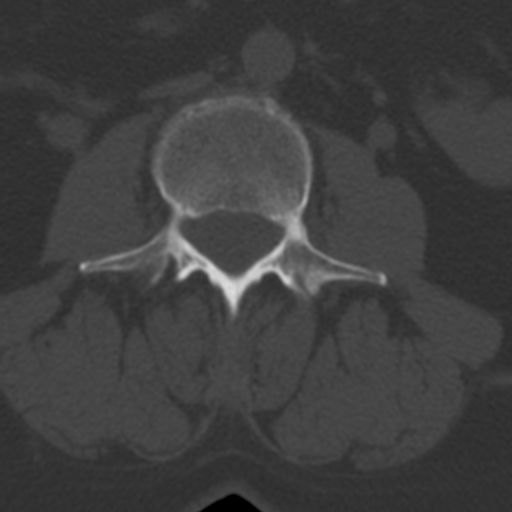
[im 67/81  bone]
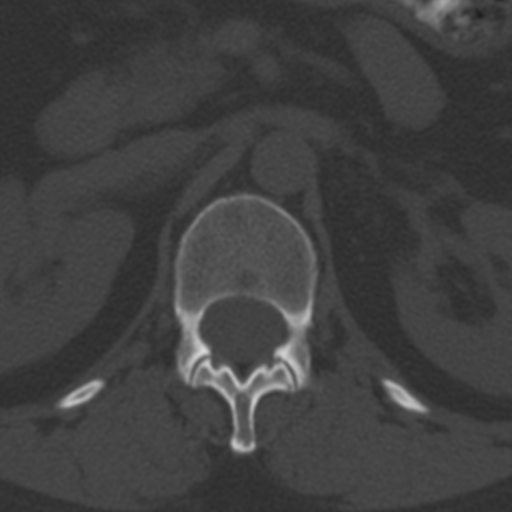

[Series 6: bone cor · coronal · 0.29mm/px · 3 of 37 slices shown]
[im 8/37  bone]
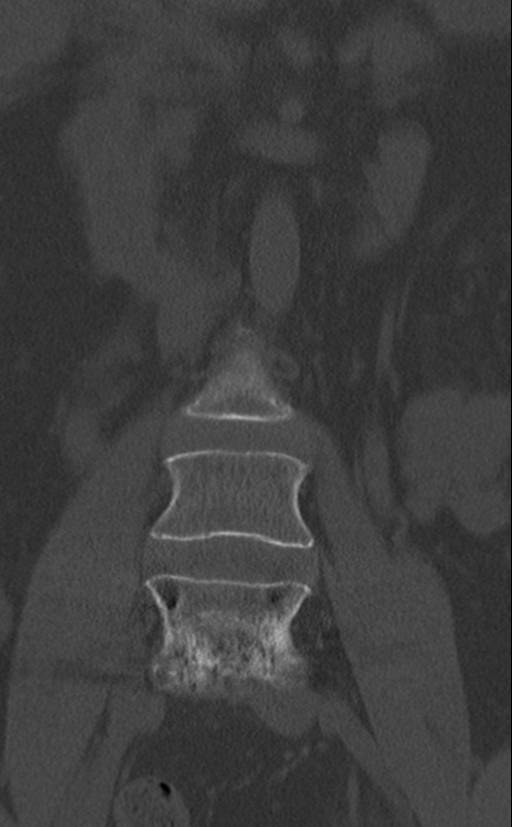
[im 15/37  bone]
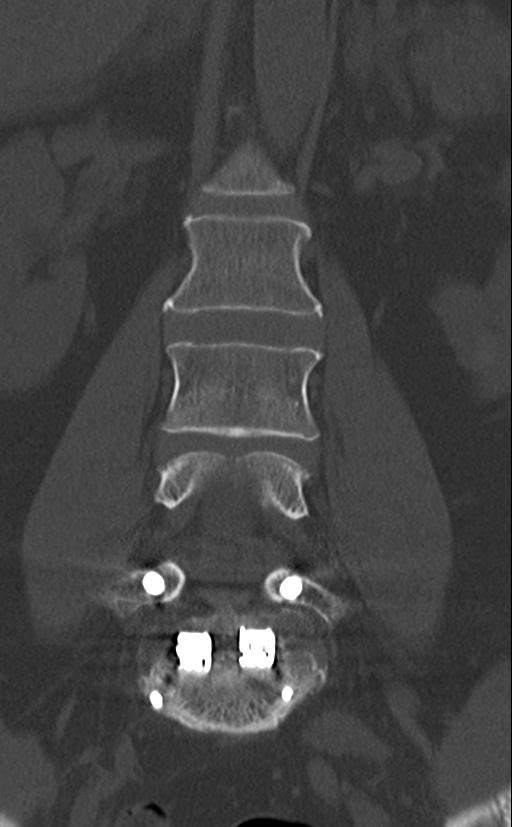
[im 22/37  bone]
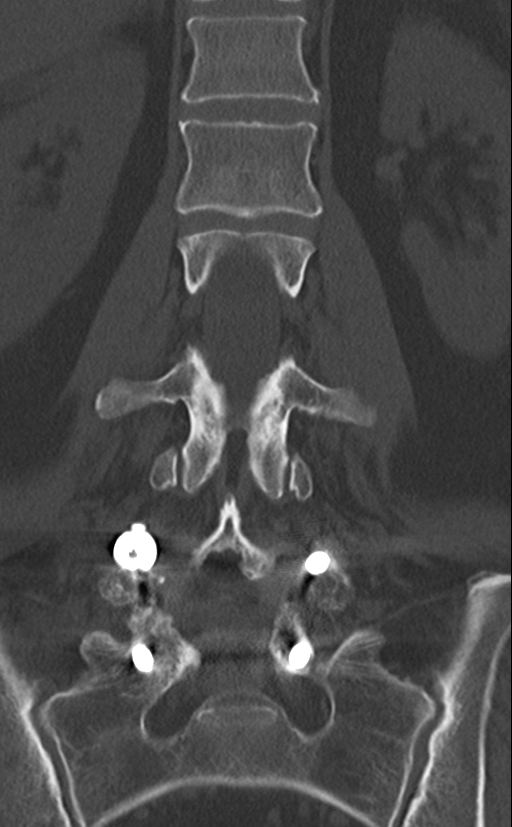

[Series 7: sag bone · sagittal · 0.29mm/px · 5 of 37 slices shown, 6 images]
[im 13/37  bone]
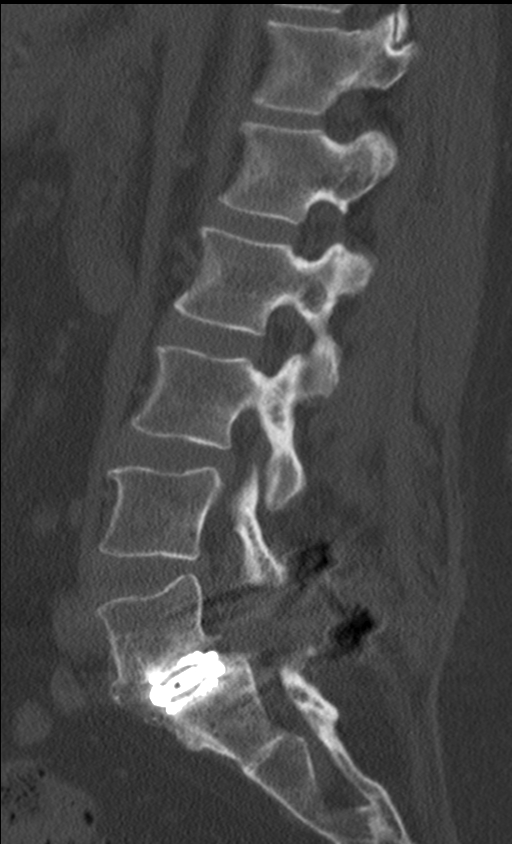
[im 16/37  bone]
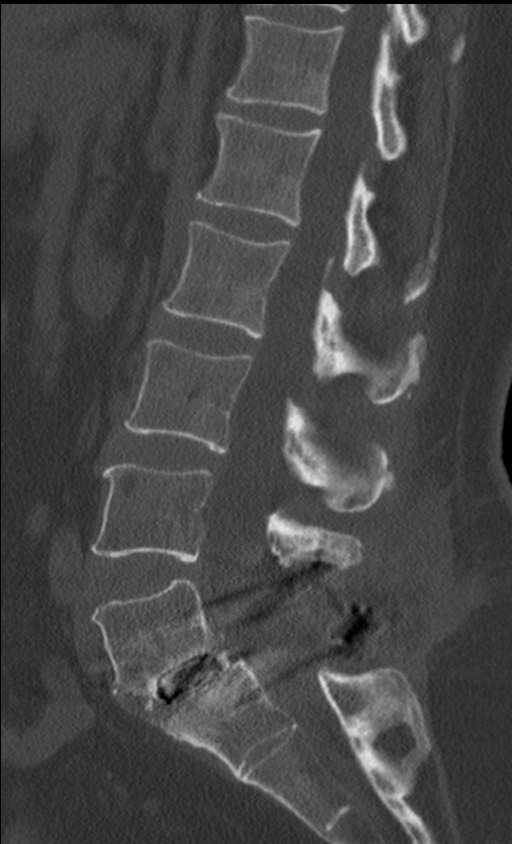
[im 19/37  soft-tissue]
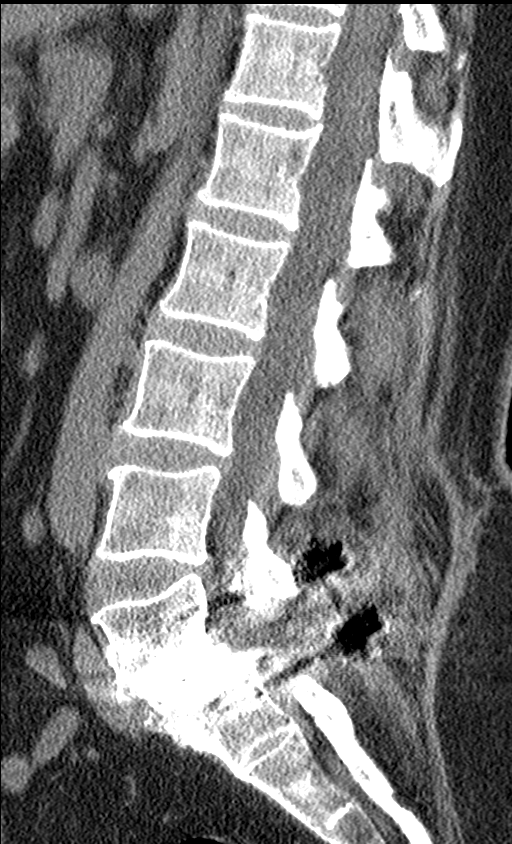
[im 19/37  bone]
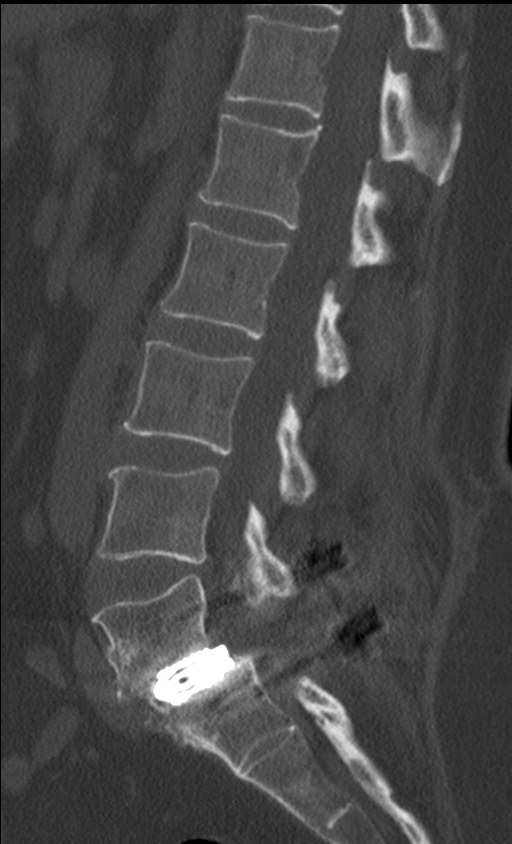
[im 22/37  bone]
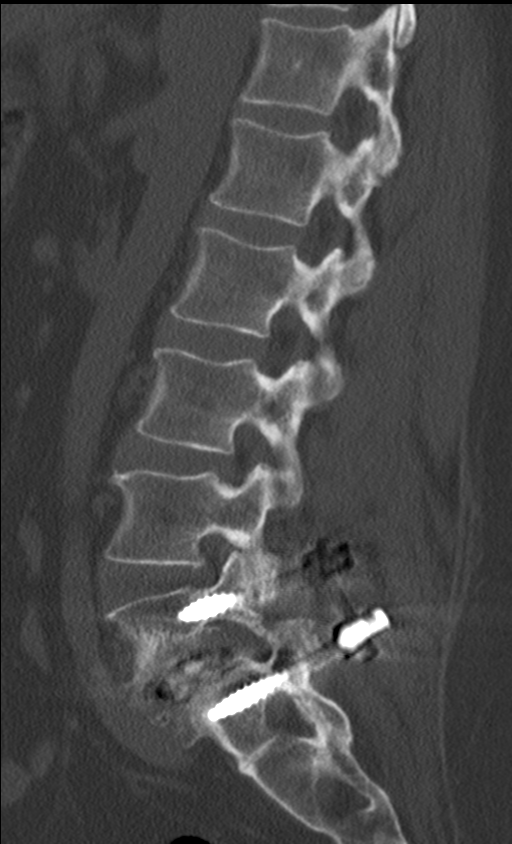
[im 25/37  bone]
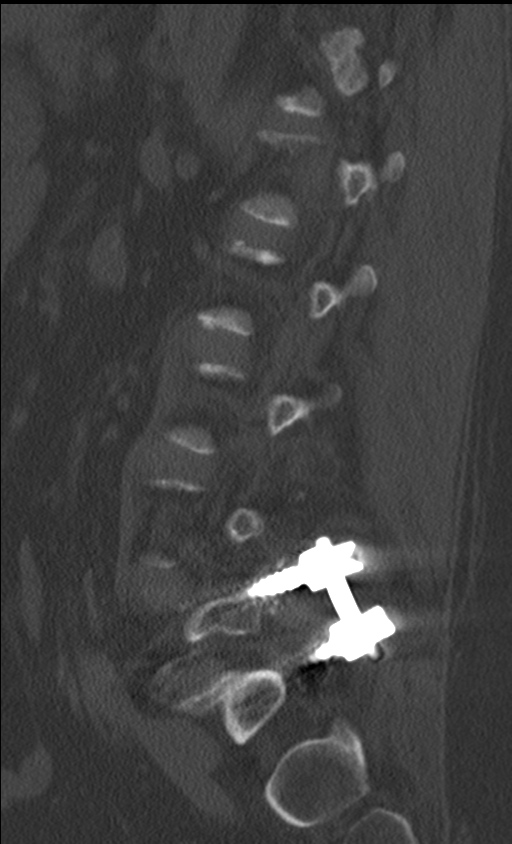

[11 of 33 positions shown; findings below may reference images not displayed]

FINDINGS: Anterolisthesis of L5 on S1 measures approximately 8 mm, slightly
less prominent compared to the prior MRI. Sequelae of interval L5-S1
PLIF are identified. Pedicle screws are present bilaterally and
appear well positioned without evidence of loosening.

Vertebral body heights are preserved without evidence of compression
fracture. Intervertebral disc space heights are preserved elsewhere
in the lumbar spine. No destructive osseous lesion. Paraspinal soft
tissues are unremarkable aside from postoperative changes.

L1-2 and L2-3:  Negative.

L3-4:  Minimal disc bulging without stenosis, unchanged.

L4-5: Mild disc bulging and mild facet arthrosis result in new, mild
left neural foraminal stenosis. Spinal canal is widely patent.

L5-S1: Interval wide posterior decompression and fusion. The
left-sided interbody cage extends 3-4 mm posterior to the L5
vertebral body. Just above the posterior aspect of the cage in the
left neural foramen is a 6 mm soft tissue focus with thin
curvilinear peripheral calcification or ossification (series 7,
image 23) which contributes to moderate residual narrowing of the
superior aspect of the left neural foramen laterally. The right
neural foramen appears adequately decompressed.
IMPRESSION: 1. Interval posterior decompression and fusion at L5-S1. Small soft
tissue focus anteriorly in the left neural foramen, indeterminate
but possibly a partially calcified residual disc fragment and
resulting in moderate narrowing of the foramen superolaterally.
2. New, mild left neural foraminal stenosis at L4-5 due to disc
bulging and mild facet arthrosis.

## 2018-08-17 ENCOUNTER — Telehealth: Payer: Self-pay

## 2018-08-17 NOTE — Telephone Encounter (Signed)
NOTES ON FILE 

## 2018-08-24 ENCOUNTER — Ambulatory Visit: Payer: 59 | Admitting: Cardiology

## 2018-08-24 ENCOUNTER — Encounter: Payer: Self-pay | Admitting: Cardiology

## 2018-08-24 VITALS — BP 155/85 | HR 76 | Ht 65.0 in | Wt 182.0 lb

## 2018-08-24 DIAGNOSIS — R079 Chest pain, unspecified: Secondary | ICD-10-CM | POA: Diagnosis not present

## 2018-08-24 NOTE — Progress Notes (Signed)
Cardiology Office Note    Date:  08/24/2018   ID:  Pamela Krueger, DOB Nov 05, 1960, MRN [id number]  PCP:  Bosie Clos, MD  Cardiologist:  Armanda Magic, MD   Chief Complaint  Patient presents with  . New Patient (Initial Visit)    Chest pain    History of Present Illness:  Pamela Krueger is a 58 y.o. female who is being seen today for the evaluation of chest pain at the request of Rice, Magnus Sinning, MD.  This is a 58 year old female with a history of GERD, migraine headaches hyperlipidemia.  She does not smoke but does have a family history of CAD in her grandfather.  She was recently seen by her PCP for routine PE and complained  of left-sided chest pain.  This occurs 3 to 4 times a week and is not related to exertion.  She denies any associated shortness of breath, nausea or diaphoresis with the pain but does admit to some dyspnea on exertion which she attributes to not being very active..  The pain will radiate sometimes into her left arm and her hands bilaterally will tingle and pain will move across her chest lasting for 45 minutes at a time.  She says taking aspirin makes it better.  She is not having problems with heartburn.  She was noted by EKG to have some ST depression and is now referred for further evaluation.  She was also recommended to try an acid reducing medication as well.  Her most recent lipid panel showed a total cholesterol of 233, LDL 164, triglycerides 108 and HDL 50.  Past Medical History:  Diagnosis Date  . Chest pain   . GERD (gastroesophageal reflux disease)   . Headache   . Knee pain   . Migraine headache   . Mixed hyperlipidemia   . Spondylolisthesis at L5-S1 level 12/04/2014    Past Surgical History:  Procedure Laterality Date  . NO PAST SURGERIES      Current Medications: Current Meds  Medication Sig  . cyclobenzaprine (FLEXERIL) 10 MG tablet Take 1 tablet (10 mg total) by mouth 3 (three) times daily as needed for muscle spasms.  Marland Kitchen  ibuprofen (ADVIL,MOTRIN) 200 MG tablet Take 200-400 mg by mouth every 8 (eight) hours as needed for mild pain.  . Multiple Vitamin (MULTIVITAMIN WITH MINERALS) TABS tablet Take 1 tablet by mouth daily.  . Omega-3 Fatty Acids (FISH OIL PO) Take 1 capsule by mouth daily.    Allergies:   Sulfa antibiotics   Social History   Socioeconomic History  . Marital status: Married    Spouse name: Not on file  . Number of children: Not on file  . Years of education: Not on file  . Highest education level: Not on file  Occupational History  . Not on file  Social Needs  . Financial resource strain: Not on file  . Food insecurity:    Worry: Not on file    Inability: Not on file  . Transportation needs:    Medical: Not on file    Non-medical: Not on file  Tobacco Use  . Smoking status: Never Smoker  . Smokeless tobacco: Never Used  Substance and Sexual Activity  . Alcohol use: Yes    Comment: weekly  . Drug use: No  . Sexual activity: Not on file  Lifestyle  . Physical activity:    Days per week: Not on file    Minutes per session: Not on file  .  Stress: Not on file  Relationships  . Social connections:    Talks on phone: Not on file    Gets together: Not on file    Attends religious service: Not on file    Active member of club or organization: Not on file    Attends meetings of clubs or organizations: Not on file    Relationship status: Not on file  Other Topics Concern  . Not on file  Social History Narrative  . Not on file     Family History:  The patient's family history includes Diabetes in her mother; Heart disease in her maternal grandfather; Lung cancer in her father.   ROS:   Please see the history of present illness.    ROS All other systems reviewed and are negative.  No flowsheet data found.     PHYSICAL EXAM:   VS:  BP (!) 155/85   Pulse 76   Ht 5\' 5"  (1.651 m)   Wt 182 lb (82.6 kg)   SpO2 98%   BMI 30.29 kg/m    GEN: Well nourished, well developed,  in no acute distress  HEENT: normal  Neck: no JVD, carotid bruits, or masses Cardiac: RRR; no murmurs, rubs, or gallops,no edema.  Intact distal pulses bilaterally.  Respiratory:  clear to auscultation bilaterally, normal work of breathing GI: soft, nontender, nondistended, + BS MS: no deformity or atrophy  Skin: warm and dry, no rash Neuro:  Alert and Oriented x 3, Strength and sensation are intact Psych: euthymic mood, full affect  Wt Readings from Last 3 Encounters:  08/24/18 182 lb (82.6 kg)  12/04/14 170 lb (77.1 kg)  11/30/14 170 lb 3.2 oz (77.2 kg)      Studies/Labs Reviewed:   EKG:  EKG is not ordered today.    Recent Labs: No results found for requested labs within last 8760 hours.   Lipid Panel No results found for: CHOL, TRIG, HDL, CHOLHDL, VLDL, LDLCALC, LDLDIRECT  Additional studies/ records that were reviewed today include:  Office notes from PCP    ASSESSMENT:    1. Exertional angina (HCC)   2. Chest pain, unspecified type      PLAN:  In order of problems listed above:  1. Chest pain - she has typical and atypical features to her chest pain.  The pain does go across her chest and down her left arm as well as in her back but is not associated with nausea, or significant shortness of breath.  She says she does get hot sometimes but she has hot flashes so it is difficult to tell the difference.  Pain is nonexertional and not really worsened by exertion.  Her only cardiac risk factors are family history remotely of CAD in her maternal grandfather and hyperlipidemia.  EKG done at her PCP office was reviewed and shows diffuse ST-T wave abnormality.  I have recommended getting a 2D echocardiogram to assess LV function.  I will also get a stress Myoview to rule out ischemia.    Medication Adjustments/Labs and Tests Ordered: Current medicines are reviewed at length with the patient today.  Concerns regarding medicines are outlined above.  Medication changes, Labs  and Tests ordered today are listed in the Patient Instructions below.  Patient Instructions  Medication Instructions:  Your physician recommends that you continue on your current medications as directed. Please refer to the Current Medication list given to you today.  If you need a refill on your cardiac medications before your next appointment,  please call your pharmacy.   Lab work: None If you have labs (blood work) drawn today and your tests are completely normal, you will receive your results only by: Marland Kitchen MyChart Message (if you have MyChart) OR . A paper copy in the mail If you have any lab test that is abnormal or we need to change your treatment, we will call you to review the results.  Testing/Procedures: Your physician has requested that you have an echocardiogram. Echocardiography is a painless test that uses sound waves to create images of your heart. It provides your doctor with information about the size and shape of your heart and how well your heart's chambers and valves are working. This procedure takes approximately one hour. There are no restrictions for this procedure.  Your physician has requested that you have en exercise stress myoview. For further information please visit [email or web address] Please follow instruction sheet, as given.  Follow-Up: As needed after results.        Signed, Armanda Magic, MD  08/24/2018 2:07 PM    [facility] [address] [place], [place], [place]  [address] Phone: [phone]; Fax: [phone]

## 2018-08-24 NOTE — Patient Instructions (Signed)
Medication Instructions:  Your physician recommends that you continue on your current medications as directed. Please refer to the Current Medication list given to you today.  If you need a refill on your cardiac medications before your next appointment, please call your pharmacy.   Lab work: None If you have labs (blood work) drawn today and your tests are completely normal, you will receive your results only by: Marland Kitchen MyChart Message (if you have MyChart) OR . A paper copy in the mail If you have any lab test that is abnormal or we need to change your treatment, we will call you to review the results.  Testing/Procedures: Your physician has requested that you have an echocardiogram. Echocardiography is a painless test that uses sound waves to create images of your heart. It provides your doctor with information about the size and shape of your heart and how well your heart's chambers and valves are working. This procedure takes approximately one hour. There are no restrictions for this procedure.  Your physician has requested that you have en exercise stress myoview. For further information please visit [email or web address] Please follow instruction sheet, as given.  Follow-Up: As needed after results.

## 2018-09-06 ENCOUNTER — Telehealth (HOSPITAL_COMMUNITY): Payer: Self-pay | Admitting: *Deleted

## 2018-09-06 NOTE — Telephone Encounter (Signed)
Patient given detailed instructions per Myocardial Perfusion Study Information Sheet for the test on 09/10/18. Patient notified to arrive 15 minutes early and that it is imperative to arrive on time for appointment to keep from having the test rescheduled.  If you need to cancel or reschedule your appointment, please call the office within 24 hours of your appointment. . Patient verbalized understanding. Ricky Ala, RN

## 2018-09-10 ENCOUNTER — Ambulatory Visit (HOSPITAL_BASED_OUTPATIENT_CLINIC_OR_DEPARTMENT_OTHER): Payer: 59

## 2018-09-10 ENCOUNTER — Ambulatory Visit (HOSPITAL_COMMUNITY): Payer: 59 | Attending: Cardiology

## 2018-09-10 ENCOUNTER — Other Ambulatory Visit: Payer: Self-pay

## 2018-09-10 DIAGNOSIS — R079 Chest pain, unspecified: Secondary | ICD-10-CM | POA: Diagnosis not present

## 2018-09-10 LAB — ECHOCARDIOGRAM COMPLETE
Height: 65 in
Weight: 2912 oz

## 2018-09-10 LAB — MYOCARDIAL PERFUSION IMAGING
CSEPED: 4 min
Estimated workload: 5.8 METS
Exercise duration (sec): 0 s
LV sys vol: 16 mL
LVDIAVOL: 56 mL (ref 46–106)
MPHR: 163 {beats}/min
Peak HR: 160 {beats}/min
Percent HR: 98 %
Rest HR: 69 {beats}/min
SDS: 2
SRS: 1
SSS: 3
TID: 1.01

## 2018-09-10 MED ORDER — TECHNETIUM TC 99M TETROFOSMIN IV KIT
31.6000 | PACK | Freq: Once | INTRAVENOUS | Status: AC | PRN
  Administered 2018-09-10: 31.6 via INTRAVENOUS
  Filled 2018-09-10: qty 32

## 2018-09-10 MED ORDER — PERFLUTREN LIPID MICROSPHERE
1.0000 mL | INTRAVENOUS | Status: AC | PRN
  Administered 2018-09-10: 2 mL via INTRAVENOUS

## 2018-09-10 MED ORDER — TECHNETIUM TC 99M TETROFOSMIN IV KIT
11.0000 | PACK | Freq: Once | INTRAVENOUS | Status: AC | PRN
  Administered 2018-09-10: 11 via INTRAVENOUS
  Filled 2018-09-10: qty 11

## 2022-11-10 ENCOUNTER — Emergency Department (HOSPITAL_BASED_OUTPATIENT_CLINIC_OR_DEPARTMENT_OTHER): Payer: Worker's Compensation

## 2022-11-10 ENCOUNTER — Emergency Department (HOSPITAL_BASED_OUTPATIENT_CLINIC_OR_DEPARTMENT_OTHER)
Admission: EM | Admit: 2022-11-10 | Discharge: 2022-11-10 | Disposition: A | Discharge status: Home / Self Care | Payer: Worker's Compensation | Attending: Emergency Medicine | Admitting: Emergency Medicine

## 2022-11-10 ENCOUNTER — Encounter (HOSPITAL_BASED_OUTPATIENT_CLINIC_OR_DEPARTMENT_OTHER): Payer: Self-pay | Admitting: Urology

## 2022-11-10 ENCOUNTER — Other Ambulatory Visit: Payer: Self-pay

## 2022-11-10 DIAGNOSIS — W010XXA Fall on same level from slipping, tripping and stumbling without subsequent striking against object, initial encounter: Secondary | ICD-10-CM | POA: Diagnosis not present

## 2022-11-10 DIAGNOSIS — Y9301 Activity, walking, marching and hiking: Secondary | ICD-10-CM | POA: Diagnosis not present

## 2022-11-10 DIAGNOSIS — S43004A Unspecified dislocation of right shoulder joint, initial encounter: Secondary | ICD-10-CM | POA: Diagnosis not present

## 2022-11-10 DIAGNOSIS — Y990 Civilian activity done for income or pay: Secondary | ICD-10-CM | POA: Insufficient documentation

## 2022-11-10 DIAGNOSIS — Y92211 Elementary school as the place of occurrence of the external cause: Secondary | ICD-10-CM | POA: Diagnosis not present

## 2022-11-10 DIAGNOSIS — S4991XA Unspecified injury of right shoulder and upper arm, initial encounter: Secondary | ICD-10-CM | POA: Diagnosis present

## 2022-11-10 MED ORDER — MORPHINE SULFATE 15 MG PO TABS: 7.5000 mg | ORAL_TABLET | ORAL | 0 refills | Status: AC | PRN

## 2022-11-10 NOTE — ED Provider Notes (Signed)
[place] [facility] Provider Note   CSN: [id number] Arrival date & time: 11/10/22  1233     History  Chief Complaint  Patient presents with   Fall   Shoulder Injury    Pamela Krueger is a 62 y.o. female.  62 yo F with a chief complaint of right shoulder pain.  She said she was walking at her job and she ended up falling.  She works at a [employer] and she is not sure exactly what she tripped on.  She felt like she had her arm externally rotated and AB ducted to about 90 degrees.  Her arm got caught on something as she went down and she felt like it was dislocated.  She felt a pop.  She ended up rolling over onto that side and felt like it went back in.  Since then she has had some pain that radiates from the outside of the shoulder down to the fingers.  She denies other injury.   Fall  Shoulder Injury       Home Medications Prior to Admission medications   Medication Sig Start Date End Date Taking? Authorizing Provider  morphine (MSIR) 15 MG tablet Take 0.5 tablets (7.5 mg total) by mouth every 4 (four) hours as needed for severe pain. 11/10/22  Yes Melene Plan, DO  cyclobenzaprine (FLEXERIL) 10 MG tablet Take 1 tablet (10 mg total) by mouth 3 (three) times daily as needed for muscle spasms. 12/05/14   Donalee Citrin, MD  ibuprofen (ADVIL,MOTRIN) 200 MG tablet Take 200-400 mg by mouth every 8 (eight) hours as needed for mild pain.    [provider]  Multiple Vitamin (MULTIVITAMIN WITH MINERALS) TABS tablet Take 1 tablet by mouth daily.    [provider]  Omega-3 Fatty Acids (FISH OIL PO) Take 1 capsule by mouth daily.    [provider]      Allergies    Sulfa antibiotics    Review of Systems   Review of Systems  Physical Exam Updated Vital Signs BP (!) 167/78 (BP Location: Left Arm)   Pulse 82   Temp 98.7 F (37.1 C)   Resp 18   Ht 5\' 5"  (1.651 m)   Wt 82.6 kg   SpO2 98%   BMI 30.30 kg/m   Physical Exam Vitals and nursing note reviewed.  Constitutional:      General: She is not in acute distress.    Appearance: She is well-developed. She is not diaphoretic.  HENT:     Head: Normocephalic and atraumatic.  Eyes:     Pupils: Pupils are equal, round, and reactive to light.  Cardiovascular:     Rate and Rhythm: Normal rate and regular rhythm.     Heart sounds: No murmur heard.    No friction rub. No gallop.  Pulmonary:     Effort: Pulmonary effort is normal.     Breath sounds: No wheezing or rales.  Abdominal:     General: There is no distension.     Palpations: Abdomen is soft.     Tenderness: There is no abdominal tenderness.  Musculoskeletal:        General: No tenderness.     Cervical back: Normal range of motion and neck supple.     Comments: Pulse motor and sensation intact to the right upper extremity.  No obvious bony tenderness along the clavicle at the [facility] joint or along the humerus.  I am able to fully  range the elbow and the wrist without discomfort.  Skin:    General: Skin is warm and dry.  Neurological:     Mental Status: She is alert and oriented to person, place, and time.  Psychiatric:        Behavior: Behavior normal.     ED Results / Procedures / Treatments   Labs (all labs ordered are listed, but only abnormal results are displayed) Labs Reviewed - No data to display  EKG None  Radiology DG Shoulder Right  Result Date: 11/10/2022 CLINICAL DATA:  Fall, right shoulder pain. EXAM: RIGHT SHOULDER - 2+ VIEW COMPARISON:  None Available. FINDINGS: No fracture or dislocation. Mild degenerative changes in the right acromioclavicular joint. Visualized right chest is unremarkable. IMPRESSION: Mild right acromioclavicular joint osteoarthritis. No acute findings. Electronically Signed   By: Leanna Battles M.D.   On: 11/10/2022 13:13    Procedures Procedures    Medications Ordered in ED Medications - No data to display  ED Course/ Medical  Decision Making/ A&P                             Medical Decision Making Amount and/or Complexity of Data Reviewed Radiology: ordered.  Risk Prescription drug management.   62 yo F with a chief complaints of right shoulder pain.  By history it sounds like she did dislocate her shoulder and then it sounds like she reduced it.  Alternatively maybe she just sprained her shoulder.  Plain film independently interpreted by me without obvious fracture or dislocation.  Will place in a sling.  Have her follow-up with her orthopedist.  1:49 PM:  I have discussed the diagnosis/risks/treatment options with the patient and family.  Evaluation and diagnostic testing in the emergency department does not suggest an emergent condition requiring admission or immediate intervention beyond what has been performed at this time.  They will follow up with Ortho. We also discussed returning to the ED immediately if new or worsening sx occur. We discussed the sx which are most concerning (e.g., sudden worsening pain, fever, inability to tolerate by mouth) that necessitate immediate return. Medications administered to the patient during their visit and any new prescriptions provided to the patient are listed below.  Medications given during this visit Medications - No data to display   The patient appears reasonably screen and/or stabilized for discharge and I doubt any other medical condition or other [facility] requiring further screening, evaluation, or treatment in the ED at this time prior to discharge.          Final Clinical Impression(s) / ED Diagnoses Final diagnoses:  Shoulder dislocation, right, initial encounter    Rx / DC Orders ED Discharge Orders          Ordered    morphine (MSIR) 15 MG tablet  Every 4 hours PRN        11/10/22 1332              Melene Plan, DO 11/10/22 1349

## 2022-11-10 NOTE — ED Triage Notes (Signed)
Mechanical fall at work, walking between lunch room tables  Felt right shoulder pop out and felt it pop back in , states pain radiating down arm to elbow  Denies head injury with fall

## 2022-11-10 NOTE — ED Notes (Signed)
Ice pack and sling applied for comfort

## 2022-11-10 NOTE — Discharge Instructions (Signed)
He should take your arm out of the sling at least 4 times a day and perform range of motion exercises.  Please follow-up with your orthopedist in the office.  Take 4 over the counter ibuprofen tablets 3 times a day or 2 over-the-counter naproxen tablets twice a day for pain. Also take tylenol 1000mg (2 extra strength) four times a day.    Then take the pain medicine if you feel like you need it. Narcotics do not help with the pain, they only make you care about it less.  You can become addicted to this, people may break into your house to steal it.  It will constipate you.  If you drive under the influence of this medicine you can get a DUI.

## 2022-11-14 ENCOUNTER — Other Ambulatory Visit: Payer: Self-pay | Admitting: Orthopedic Surgery

## 2022-11-14 ENCOUNTER — Encounter: Payer: Self-pay | Admitting: Orthopedic Surgery

## 2022-11-14 DIAGNOSIS — M25511 Pain in right shoulder: Secondary | ICD-10-CM

## 2022-11-15 ENCOUNTER — Encounter: Payer: Self-pay | Admitting: Orthopedic Surgery

## 2022-11-21 ENCOUNTER — Ambulatory Visit
Admission: RE | Admit: 2022-11-21 | Discharge: 2022-11-21 | Disposition: A | Discharge status: Home / Self Care | Payer: Worker's Compensation | Source: Ambulatory Visit | Attending: Orthopedic Surgery | Admitting: Orthopedic Surgery

## 2022-11-21 DIAGNOSIS — M25511 Pain in right shoulder: Secondary | ICD-10-CM

## 2022-11-22 ENCOUNTER — Ambulatory Visit
Admission: RE | Admit: 2022-11-22 | Discharge: 2022-11-22 | Disposition: A | Discharge status: Home / Self Care | Payer: 59 | Source: Ambulatory Visit | Attending: Orthopedic Surgery | Admitting: Orthopedic Surgery

## 2022-11-22 DIAGNOSIS — M25511 Pain in right shoulder: Secondary | ICD-10-CM

## 2022-12-01 ENCOUNTER — Encounter: Payer: Self-pay | Admitting: Adult Health Nurse Practitioner
